# Patient Record
Sex: Female | Born: 1965 | Race: White | Marital: Married | Smoking: Former smoker
Health system: Southern US, Community
[De-identification: ages and names within clinical notes are randomized; demographics above are authoritative.]

## PROBLEM LIST (undated history)

## (undated) HISTORY — PX: WISDOM TOOTH EXTRACTION: SHX21

## (undated) HISTORY — PX: CLOSED REDUCTION SHOULDER DISLOCATION: SUR242

---

## 2020-10-01 ENCOUNTER — Other Ambulatory Visit: Payer: Self-pay | Admitting: Student

## 2020-10-01 DIAGNOSIS — R1013 Epigastric pain: Secondary | ICD-10-CM

## 2020-10-02 ENCOUNTER — Other Ambulatory Visit: Payer: Self-pay

## 2020-10-02 ENCOUNTER — Ambulatory Visit
Admission: RE | Admit: 2020-10-02 | Discharge: 2020-10-02 | Disposition: A | Discharge status: Home / Self Care | Payer: BC Managed Care – PPO | Source: Ambulatory Visit | Attending: Student | Admitting: Student

## 2020-10-02 DIAGNOSIS — G8929 Other chronic pain: Secondary | ICD-10-CM | POA: Insufficient documentation

## 2020-10-02 DIAGNOSIS — R1013 Epigastric pain: Secondary | ICD-10-CM | POA: Diagnosis present

## 2020-11-10 ENCOUNTER — Ambulatory Visit: Payer: Self-pay | Admitting: General Surgery

## 2020-11-10 NOTE — H&P (View-Only) (Signed)
PATIENT PROFILE: Kristin Chen is a 55 y.o. female who presents to the Clinic for consultation at the request of Dr. Bjorn Loser for evaluation of cholelithiasis.   PCP:  None   HISTORY OF PRESENT ILLNESS: Kristin Chen reports having abdominal pain since a year ago.  She endorses that the pain is mainly in the epigastric area.  The pain radiates to her back.  If any usually, night and wake her up.  She cannot associate any alleviating or aggravating factors.  Episode of pain duration a few hours.  Frequency of episodes are 2 or 3 times per month.  Denies fever or chills.  Endorses having associated nausea and vomiting with some of the episodes.   She had an ultrasound of the abdomen done that shows that her gallbladder is full of stones.  No gallbladder wall thickening or pericholecystic fluid.  I personally evaluated the images.  There is     PROBLEM LIST: Problem List  Date Reviewed: 09/30/2020  None        GENERAL REVIEW OF SYSTEMS:    General ROS: negative for - chills, fatigue, fever, weight gain or weight loss Allergy and Immunology ROS: negative for - hives  Hematological and Lymphatic ROS: negative for - bleeding problems or bruising, negative for palpable nodes Endocrine ROS: negative for - heat or cold intolerance, hair changes Respiratory ROS: negative for - cough, shortness of breath or wheezing Cardiovascular ROS: no chest pain or palpitations GI ROS: Positive for nausea, vomiting, abdominal pain, diarrhea Musculoskeletal ROS: negative for - joint swelling or muscle pain Neurological ROS: negative for - confusion, syncope Dermatological ROS: negative for pruritus and rash Psychiatric: negative for anxiety, depression, difficulty sleeping and memory loss   MEDICATIONS: Current Medications        Current Outpatient Medications  Medication Sig Dispense Refill   amoxicillin (AMOXIL) 500 MG tablet Take 2 tablets (1,000 mg total) by mouth 2 (two) times daily for 14 days 56 tablet 0    clarithromycin (BIAXIN) 500 MG tablet Take 1 tablet (500 mg total) by mouth every 12 (twelve) hours for 14 days 28 tablet 0   esomeprazole (NEXIUM) 20 MG DR capsule Take 1 capsule (20 mg total) by mouth once daily for 14 days 14 capsule 0   ondansetron (ZOFRAN-ODT) 4 MG disintegrating tablet Take 1 tablet (4 mg total) by mouth every 8 (eight) hours as needed for Nausea or Vomiting 20 tablet 0    No current facility-administered medications for this visit.        ALLERGIES: Patient has no known allergies.   PAST MEDICAL HISTORY: History reviewed. No pertinent past medical history.   PAST SURGICAL HISTORY: History reviewed. No pertinent surgical history.    FAMILY HISTORY:      Family History  Problem Relation Age of Onset   Osteopenia Mother     Osteopenia Sister     Coronary Artery Disease (Blocked arteries around heart) Maternal Grandfather        SOCIAL HISTORY: Social History           Socioeconomic History   Marital status: Married  Tobacco Use   Smoking status: Former Smoker      Quit date: 1997      Years since quitting: 25.5   Smokeless tobacco: Never Used  Scientific laboratory technician Use: Never used  Substance and Sexual Activity   Alcohol use: Yes      Comment: occasionally   Drug use: Never  PHYSICAL EXAM:    Vitals:    10/10/20 0837  BP: 137/89  Pulse: 66    Body mass index is 26.31 kg/m. Weight: 73.9 kg (163 lb)    GENERAL: Alert, active, oriented x3   HEENT: Pupils equal reactive to light. Extraocular movements are intact. Sclera clear. Palpebral conjunctiva normal red color.Pharynx clear.   NECK: Supple with no palpable mass and no adenopathy.   LUNGS: Sound clear with no rales rhonchi or wheezes.   HEART: Regular rhythm S1 and S2 without murmur.   ABDOMEN: Soft and depressible, nontender with no palpable mass, no hepatomegaly.    EXTREMITIES: Well-developed well-nourished symmetrical with no dependent edema.   NEUROLOGICAL: Awake  alert oriented, facial expression symmetrical, moving all extremities.   REVIEW OF DATA: I have reviewed the following data today:      Office Visit on 09/29/2020  Component Date Value   WBC (White Blood Cell Co* 09/29/2020 8.1    RBC (Red Blood Cell Coun* 09/29/2020 5.00    Hemoglobin 09/29/2020 14.5    Hematocrit 09/29/2020 41.2    MCV (Mean Corpuscular Vo* 09/29/2020 82.4    MCH (Mean Corpuscular He* 09/29/2020 29.0    MCHC (Mean Corpuscular H* 09/29/2020 35.2    Platelet Count 09/29/2020 267    RDW-CV (Red Cell Distrib* 09/29/2020 12.3    MPV (Mean Platelet Volum* 09/29/2020 9.8    Neutrophils 09/29/2020 5.04    Lymphocytes 09/29/2020 2.22    Monocytes 09/29/2020 0.70    Eosinophils 09/29/2020 0.08    Basophils 09/29/2020 0.03    Neutrophil % 09/29/2020 62.3    Lymphocyte % 09/29/2020 27.5    Monocyte % 09/29/2020 8.7    Eosinophil % 09/29/2020 1.0    Basophil% 09/29/2020 0.4    Immature Granulocyte % 09/29/2020 0.1    Immature Granulocyte Cou* 09/29/2020 0.01    Glucose 09/29/2020 95    Sodium 09/29/2020 140    Potassium 09/29/2020 3.9    Chloride 09/29/2020 108    Carbon Dioxide (CO2) 09/29/2020 25.8    Urea Nitrogen (BUN) 09/29/2020 15    Creatinine 09/29/2020 0.7    Glomerular Filtration Ra* 09/29/2020 88    Calcium 09/29/2020 9.0    AST  09/29/2020 18    ALT  09/29/2020 15    Alk Phos (alkaline Phosp* 09/29/2020 74    Albumin 09/29/2020 4.1    Bilirubin, Total 09/29/2020 0.5    Protein, Total 09/29/2020 7.0    A/G Ratio 09/29/2020 1.4    Amylase 09/29/2020 52    Lipase 09/29/2020 40    Color 09/29/2020 Yellow    Clarity 09/29/2020 Clear    Specific Gravity 09/29/2020 >=1.030    pH, Urine 09/29/2020 6.0    Protein, Urinalysis 09/29/2020 100  (!)   Glucose, Urinalysis 09/29/2020 Negative    Ketones, Urinalysis 09/29/2020 Negative    Blood, Urinalysis 09/29/2020 Trace (!)   Nitrite, Urinalysis 09/29/2020 Negative    Leukocyte Esterase, Urin* 09/29/2020  Negative    White Blood Cells, Urina* 09/29/2020 None Seen    Red Blood Cells, Urinaly* 09/29/2020 None Seen    Bacteria, Urinalysis 09/29/2020 None Seen    Squamous Epithelial Cell* 09/29/2020 None Seen    H. pylori, IgA Abs - Lab* 09/29/2020 19.7 (!)   H Pylori IgG - [facility] 09/29/2020 0.24    Vent Rate (bpm) 09/29/2020 52    PR Interval (msec) 09/29/2020 114    QRS Interval (msec) 09/29/2020 88    QT Interval (msec) 09/29/2020 428      QTc (msec) 09/29/2020 398       ASSESSMENT: Ms. Linderman is a 54 y.o. female presenting for consultation for cholelithiasis.     Patient was oriented about the diagnosis of cholelithiasis. Also oriented about what is the gallbladder, its anatomy and function and the implications of having stones. The patient was oriented about the treatment alternatives (observation vs cholecystectomy). Patient was oriented that a low percentage of patient will continue to have similar pain symptoms even after the gallbladder is removed. Surgical technique (open vs laparoscopic) was discussed. It was also discussed the goals of the surgery (decrease the pain episodes and avoid the risk of cholecystitis) and the risk of surgery including: bleeding, infection, common bile duct injury, stone retention, injury to other organs such as bowel, liver, stomach, other complications such as hernia, bowel obstruction among others. Also discussed with patient about anesthesia and its complications such as: reaction to medications, pneumonia, heart complications, death, among others.    Cholelithiasis without cholecystitis [K80.20]   PLAN: Robotic assisted laparoscopic cholecystectomy ([id number])   Patient verbalized understanding, all questions were answered, and were agreeable with the plan outlined above.        Shirly Bartosiewicz Cintron-Diaz, MD  

## 2020-11-10 NOTE — H&P (Signed)
PATIENT PROFILE: Kristin Chen is a 55 y.o. female who presents to the Clinic for consultation at the request of Dr. Bjorn Loser for evaluation of cholelithiasis.   PCP:  None   HISTORY OF PRESENT ILLNESS: Kristin Chen reports having abdominal pain since a year ago.  She endorses that the pain is mainly in the epigastric area.  The pain radiates to her back.  If any usually, night and wake her up.  She cannot associate any alleviating or aggravating factors.  Episode of pain duration a few hours.  Frequency of episodes are 2 or 3 times per month.  Denies fever or chills.  Endorses having associated nausea and vomiting with some of the episodes.   She had an ultrasound of the abdomen done that shows that her gallbladder is full of stones.  No gallbladder wall thickening or pericholecystic fluid.  I personally evaluated the images.  There is     PROBLEM LIST: Problem List  Date Reviewed: 09/30/2020  None        GENERAL REVIEW OF SYSTEMS:    General ROS: negative for - chills, fatigue, fever, weight gain or weight loss Allergy and Immunology ROS: negative for - hives  Hematological and Lymphatic ROS: negative for - bleeding problems or bruising, negative for palpable nodes Endocrine ROS: negative for - heat or cold intolerance, hair changes Respiratory ROS: negative for - cough, shortness of breath or wheezing Cardiovascular ROS: no chest pain or palpitations GI ROS: Positive for nausea, vomiting, abdominal pain, diarrhea Musculoskeletal ROS: negative for - joint swelling or muscle pain Neurological ROS: negative for - confusion, syncope Dermatological ROS: negative for pruritus and rash Psychiatric: negative for anxiety, depression, difficulty sleeping and memory loss   MEDICATIONS: Current Medications        Current Outpatient Medications  Medication Sig Dispense Refill   amoxicillin (AMOXIL) 500 MG tablet Take 2 tablets (1,000 mg total) by mouth 2 (two) times daily for 14 days 56 tablet 0    clarithromycin (BIAXIN) 500 MG tablet Take 1 tablet (500 mg total) by mouth every 12 (twelve) hours for 14 days 28 tablet 0   esomeprazole (NEXIUM) 20 MG DR capsule Take 1 capsule (20 mg total) by mouth once daily for 14 days 14 capsule 0   ondansetron (ZOFRAN-ODT) 4 MG disintegrating tablet Take 1 tablet (4 mg total) by mouth every 8 (eight) hours as needed for Nausea or Vomiting 20 tablet 0    No current facility-administered medications for this visit.        ALLERGIES: Patient has no known allergies.   PAST MEDICAL HISTORY: History reviewed. No pertinent past medical history.   PAST SURGICAL HISTORY: History reviewed. No pertinent surgical history.    FAMILY HISTORY:      Family History  Problem Relation Age of Onset   Osteopenia Mother     Osteopenia Sister     Coronary Artery Disease (Blocked arteries around heart) Maternal Grandfather        SOCIAL HISTORY: Social History           Socioeconomic History   Marital status: Married  Tobacco Use   Smoking status: Former Smoker      Quit date: 1997      Years since quitting: 25.5   Smokeless tobacco: Never Used  Scientific laboratory technician Use: Never used  Substance and Sexual Activity   Alcohol use: Yes      Comment: occasionally   Drug use: Never  PHYSICAL EXAM:    Vitals:    10/10/20 0837  BP: 137/89  Pulse: 66    Body mass index is 26.31 kg/m. Weight: 73.9 kg (163 lb)    GENERAL: Alert, active, oriented x3   HEENT: Pupils equal reactive to light. Extraocular movements are intact. Sclera clear. Palpebral conjunctiva normal red color.Pharynx clear.   NECK: Supple with no palpable mass and no adenopathy.   LUNGS: Sound clear with no rales rhonchi or wheezes.   HEART: Regular rhythm S1 and S2 without murmur.   ABDOMEN: Soft and depressible, nontender with no palpable mass, no hepatomegaly.    EXTREMITIES: Well-developed well-nourished symmetrical with no dependent edema.   NEUROLOGICAL: Awake  alert oriented, facial expression symmetrical, moving all extremities.   REVIEW OF DATA: I have reviewed the following data today:      Office Visit on 09/29/2020  Component Date Value   WBC (White Blood Cell Co* 09/29/2020 8.1    RBC (Red Blood Cell Coun* 09/29/2020 5.00    Hemoglobin 09/29/2020 14.5    Hematocrit 09/29/2020 41.2    MCV (Mean Corpuscular Vo* 09/29/2020 82.4    MCH (Mean Corpuscular He* 09/29/2020 29.0    MCHC (Mean Corpuscular H* 09/29/2020 35.2    Platelet Count 09/29/2020 267    RDW-CV (Red Cell Distrib* 09/29/2020 12.3    MPV (Mean Platelet Volum* 09/29/2020 9.8    Neutrophils 09/29/2020 5.04    Lymphocytes 09/29/2020 2.22    Monocytes 09/29/2020 0.70    Eosinophils 09/29/2020 0.08    Basophils 09/29/2020 0.03    Neutrophil % 09/29/2020 62.3    Lymphocyte % 09/29/2020 27.5    Monocyte % 09/29/2020 8.7    Eosinophil % 09/29/2020 1.0    Basophil% 09/29/2020 0.4    Immature Granulocyte % 09/29/2020 0.1    Immature Granulocyte Cou* 09/29/2020 0.01    Glucose 09/29/2020 95    Sodium 09/29/2020 140    Potassium 09/29/2020 3.9    Chloride 09/29/2020 108    Carbon Dioxide (CO2) 09/29/2020 25.8    Urea Nitrogen (BUN) 09/29/2020 15    Creatinine 09/29/2020 0.7    Glomerular Filtration Ra* 09/29/2020 88    Calcium 09/29/2020 9.0    AST  09/29/2020 18    ALT  09/29/2020 15    Alk Phos (alkaline Phosp* 09/29/2020 74    Albumin 09/29/2020 4.1    Bilirubin, Total 09/29/2020 0.5    Protein, Total 09/29/2020 7.0    A/G Ratio 09/29/2020 1.4    Amylase 09/29/2020 52    Lipase 09/29/2020 40    Color 09/29/2020 Yellow    Clarity 09/29/2020 Clear    Specific Gravity 09/29/2020 >=1.030    pH, Urine 09/29/2020 6.0    Protein, Urinalysis 09/29/2020 100  (!)   Glucose, Urinalysis 09/29/2020 Negative    Ketones, Urinalysis 09/29/2020 Negative    Blood, Urinalysis 09/29/2020 Trace (!)   Nitrite, Urinalysis 09/29/2020 Negative    Leukocyte Esterase, Urin* 09/29/2020  Negative    White Blood Cells, Urina* 09/29/2020 None Seen    Red Blood Cells, Urinaly* 09/29/2020 None Seen    Bacteria, Urinalysis 09/29/2020 None Seen    Squamous Epithelial Cell* 09/29/2020 None Seen    H. pylori, IgA Abs - Lab* 09/29/2020 19.7 (!)   H Pylori IgG - [facility] 09/29/2020 0.24    Vent Rate (bpm) 09/29/2020 52    PR Interval (msec) 09/29/2020 114    QRS Interval (msec) 09/29/2020 88    QT Interval (msec) 09/29/2020 428      QTc (msec) 09/29/2020 398       ASSESSMENT: Kristin Chen is a 54 y.o. female presenting for consultation for cholelithiasis.     Patient was oriented about the diagnosis of cholelithiasis. Also oriented about what is the gallbladder, its anatomy and function and the implications of having stones. The patient was oriented about the treatment alternatives (observation vs cholecystectomy). Patient was oriented that a low percentage of patient will continue to have similar pain symptoms even after the gallbladder is removed. Surgical technique (open vs laparoscopic) was discussed. It was also discussed the goals of the surgery (decrease the pain episodes and avoid the risk of cholecystitis) and the risk of surgery including: bleeding, infection, common bile duct injury, stone retention, injury to other organs such as bowel, liver, stomach, other complications such as hernia, bowel obstruction among others. Also discussed with patient about anesthesia and its complications such as: reaction to medications, pneumonia, heart complications, death, among others.    Cholelithiasis without cholecystitis [K80.20]   PLAN: Robotic assisted laparoscopic cholecystectomy ([id number])   Patient verbalized understanding, all questions were answered, and were agreeable with the plan outlined above.        Edgardo Cintron-Diaz, MD  

## 2020-11-14 ENCOUNTER — Encounter
Admission: RE | Admit: 2020-11-14 | Discharge: 2020-11-14 | Disposition: A | Discharge status: Home / Self Care | Payer: BC Managed Care – PPO | Source: Ambulatory Visit | Attending: General Surgery | Admitting: General Surgery

## 2020-11-14 ENCOUNTER — Other Ambulatory Visit: Payer: Self-pay

## 2020-11-14 NOTE — Patient Instructions (Signed)
Your procedure is scheduled on:  Report to DAY SURGERY DEPARTMENT LOCATED ON 2ND FLOOR MEDICAL MALL ENTRANCE. To find out your arrival time please call [phone] between 1PM - 3PM on .  Remember: Instructions that are not followed completely may result in serious medical risk, up to and including death, or upon the discretion of your surgeon and anesthesiologist your surgery may need to be rescheduled.     _X__ 1. Do not eat food after midnight the night before your procedure.                 No gum chewing or hard candies. You may drink clear liquids up to 2 hours                 before you are scheduled to arrive for your surgery- DO not drink clear                 liquids within 2 hours of the start of your surgery.                 Clear Liquids include:  water, apple juice without pulp, clear carbohydrate                 drink such as Clearfast or Gatorade, Black Coffee or Tea (Do not add                 anything to coffee or tea). Diabetics water only  __X__2.  On the morning of surgery brush your teeth with toothpaste and water, you                 may rinse your mouth with mouthwash if you wish.  Do not swallow any              toothpaste of mouthwash.     _X__ 3.  No Alcohol for 24 hours before or after surgery.   _X__ 4.  Do Not Smoke or use e-cigarettes For 24 Hours Prior to Your Surgery.                 Do not use any chewable tobacco products for at least 6 hours prior to                 surgery.  ____  5.  Bring all medications with you on the day of surgery if instructed.   __X__  6.  Notify your doctor if there is any change in your medical condition      (cold, fever, infections).     Do not wear jewelry, make-up, hairpins, clips or nail polish. Do not wear lotions, powders, or perfumes.  Do not shave 48 hours prior to surgery. Men may shave face and neck. Do not bring valuables to the hospital.    [facility] is not responsible for any belongings or  valuables.  Contacts, dentures/partials or body piercings may not be worn into surgery. Bring a case for your contacts, glasses or hearing aids, a denture cup will be supplied. Leave your suitcase in the car. After surgery it may be brought to your room. For patients admitted to the hospital, discharge time is determined by your treatment team.   Patients discharged the day of surgery will not be allowed to drive home.   Please read over the following fact sheets that you were given:   MRSA Information  __X__ Take these medicines the morning of surgery with A SIP OF WATER:      1.   2.   3.   4.  5.  6.  ____ Fleet Enema (as directed)   __X__ Use CHG Soap/SAGE wipes as directed  ____ Use inhalers on the day of surgery  ____ Stop metformin/Janumet/Farxiga 2 days prior to surgery    ____ Take 1/2 of usual insulin dose the night before surgery. No insulin the morning          of surgery.   ____ Stop Blood Thinners Coumadin/Plavix/Xarelto/Pleta/Pradaxa/Eliquis/Effient/Aspirin  on   Or contact your Surgeon, Cardiologist or Medical Doctor regarding  ability to stop your blood thinners  __X__ Stop Anti-inflammatories 7 days before surgery such as Advil, Ibuprofen, Motrin,  BC or Goodies Powder, Naprosyn, Naproxen, Aleve, Aspirin    __X__ Stop all herbal supplements, fish oil or vitamin E until after surgery.    ____ Bring C-Pap to the hospital.      

## 2020-11-21 ENCOUNTER — Encounter: Payer: Self-pay | Admitting: General Surgery

## 2020-11-21 ENCOUNTER — Ambulatory Visit
Admission: RE | Admit: 2020-11-21 | Discharge: 2020-11-21 | Disposition: A | Discharge status: Home / Self Care | Payer: BC Managed Care – PPO | Attending: General Surgery | Admitting: General Surgery

## 2020-11-21 ENCOUNTER — Other Ambulatory Visit: Payer: Self-pay

## 2020-11-21 ENCOUNTER — Ambulatory Visit: Payer: BC Managed Care – PPO | Admitting: Anesthesiology

## 2020-11-21 ENCOUNTER — Encounter
Admission: RE | Disposition: A | Discharge status: Home / Self Care | Payer: Self-pay | Source: Home / Self Care | Attending: General Surgery

## 2020-11-21 DIAGNOSIS — Z87891 Personal history of nicotine dependence: Secondary | ICD-10-CM | POA: Insufficient documentation

## 2020-11-21 DIAGNOSIS — K8012 Calculus of gallbladder with acute and chronic cholecystitis without obstruction: Secondary | ICD-10-CM | POA: Insufficient documentation

## 2020-11-21 SURGERY — CHOLECYSTECTOMY, ROBOT-ASSISTED, LAPAROSCOPIC
Anesthesia: General | Site: Abdomen

## 2020-11-21 MED ORDER — CHLORHEXIDINE GLUCONATE 0.12 % MT SOLN
OROMUCOSAL | Status: AC
  Administered 2020-11-21: 15 mL via OROMUCOSAL
  Filled 2020-11-21: qty 15

## 2020-11-21 MED ORDER — INDOCYANINE GREEN 25 MG IV SOLR
INTRAVENOUS | Status: AC
  Filled 2020-11-21: qty 10

## 2020-11-21 MED ORDER — CEFAZOLIN SODIUM-DEXTROSE 2-4 GM/100ML-% IV SOLN
INTRAVENOUS | Status: AC
  Filled 2020-11-21: qty 100

## 2020-11-21 MED ORDER — ACETAMINOPHEN 10 MG/ML IV SOLN
INTRAVENOUS | Status: AC
  Filled 2020-11-21: qty 100

## 2020-11-21 MED ORDER — PROPOFOL 10 MG/ML IV BOLUS
INTRAVENOUS | Status: DC | PRN
  Administered 2020-11-21: 120 mg via INTRAVENOUS
  Administered 2020-11-21: 30 mg via INTRAVENOUS
  Administered 2020-11-21: 25 mg via INTRAVENOUS

## 2020-11-21 MED ORDER — LIDOCAINE HCL (PF) 2 % IJ SOLN
INTRAMUSCULAR | Status: AC
  Filled 2020-11-21: qty 5

## 2020-11-21 MED ORDER — MIDAZOLAM HCL 2 MG/2ML IJ SOLN
INTRAMUSCULAR | Status: AC
  Filled 2020-11-21: qty 2

## 2020-11-21 MED ORDER — SUGAMMADEX SODIUM 200 MG/2ML IV SOLN
INTRAVENOUS | Status: DC | PRN
  Administered 2020-11-21: 100 mg via INTRAVENOUS
  Administered 2020-11-21: 50 mg via INTRAVENOUS

## 2020-11-21 MED ORDER — KETOROLAC TROMETHAMINE 30 MG/ML IJ SOLN
INTRAMUSCULAR | Status: AC
  Filled 2020-11-21: qty 1

## 2020-11-21 MED ORDER — DEXAMETHASONE SODIUM PHOSPHATE 10 MG/ML IJ SOLN
INTRAMUSCULAR | Status: DC | PRN
  Administered 2020-11-21: 10 mg via INTRAVENOUS

## 2020-11-21 MED ORDER — LIDOCAINE HCL (CARDIAC) PF 100 MG/5ML IV SOSY
PREFILLED_SYRINGE | INTRAVENOUS | Status: DC | PRN
  Administered 2020-11-21: 50 mg via INTRAVENOUS

## 2020-11-21 MED ORDER — HYDROCODONE-ACETAMINOPHEN 5-325 MG PO TABS: 1.0000 | ORAL_TABLET | ORAL | 0 refills | Status: AC | PRN

## 2020-11-21 MED ORDER — MIDAZOLAM HCL 2 MG/2ML IJ SOLN
INTRAMUSCULAR | Status: DC | PRN
  Administered 2020-11-21 (×2): 1 mg via INTRAVENOUS

## 2020-11-21 MED ORDER — ORAL CARE MOUTH RINSE: 15.0000 mL | Freq: Once | OROMUCOSAL | Status: AC

## 2020-11-21 MED ORDER — LACTATED RINGERS IV SOLN: INTRAVENOUS | Status: DC

## 2020-11-21 MED ORDER — ACETAMINOPHEN 10 MG/ML IV SOLN
INTRAVENOUS | Status: DC | PRN
  Administered 2020-11-21: 1000 mg via INTRAVENOUS

## 2020-11-21 MED ORDER — FENTANYL CITRATE (PF) 100 MCG/2ML IJ SOLN
INTRAMUSCULAR | Status: AC
  Filled 2020-11-21: qty 2

## 2020-11-21 MED ORDER — KETOROLAC TROMETHAMINE 30 MG/ML IJ SOLN
INTRAMUSCULAR | Status: DC | PRN
Start: 2020-11-21 — End: 2020-11-21
  Administered 2020-11-21: 30 mg via INTRAVENOUS

## 2020-11-21 MED ORDER — ROCURONIUM BROMIDE 100 MG/10ML IV SOLN
INTRAVENOUS | Status: DC | PRN
  Administered 2020-11-21: 60 mg via INTRAVENOUS

## 2020-11-21 MED ORDER — PROPOFOL 10 MG/ML IV BOLUS
INTRAVENOUS | Status: AC
  Filled 2020-11-21: qty 20

## 2020-11-21 MED ORDER — ONDANSETRON HCL 4 MG/2ML IJ SOLN
INTRAMUSCULAR | Status: DC | PRN
  Administered 2020-11-21: 4 mg via INTRAVENOUS

## 2020-11-21 MED ORDER — BUPIVACAINE-EPINEPHRINE (PF) 0.25% -1:200000 IJ SOLN
INTRAMUSCULAR | Status: AC
  Filled 2020-11-21: qty 30

## 2020-11-21 MED ORDER — 0.9 % SODIUM CHLORIDE (POUR BTL) OPTIME
TOPICAL | Status: DC | PRN
  Administered 2020-11-21: 500 mL

## 2020-11-21 MED ORDER — DEXAMETHASONE SODIUM PHOSPHATE 10 MG/ML IJ SOLN
INTRAMUSCULAR | Status: AC
  Filled 2020-11-21: qty 1

## 2020-11-21 MED ORDER — FENTANYL CITRATE (PF) 100 MCG/2ML IJ SOLN
INTRAMUSCULAR | Status: DC | PRN
  Administered 2020-11-21 (×2): 50 ug via INTRAVENOUS

## 2020-11-21 MED ORDER — INDOCYANINE GREEN 25 MG IV SOLR
1.2500 mg | Freq: Once | INTRAVENOUS | Status: AC
  Administered 2020-11-21: 1.25 mg via INTRAVENOUS
  Filled 2020-11-21: qty 0.5

## 2020-11-21 MED ORDER — SODIUM CHLORIDE 0.9 % IR SOLN
Status: DC | PRN
  Administered 2020-11-21: 1000 mL

## 2020-11-21 MED ORDER — ONDANSETRON HCL 4 MG/2ML IJ SOLN: 4.0000 mg | Freq: Once | INTRAMUSCULAR | Status: DC | PRN

## 2020-11-21 MED ORDER — BUPIVACAINE-EPINEPHRINE 0.25% -1:200000 IJ SOLN
INTRAMUSCULAR | Status: DC | PRN
  Administered 2020-11-21: 30 mL

## 2020-11-21 MED ORDER — FAMOTIDINE 20 MG PO TABS: 20.0000 mg | ORAL_TABLET | Freq: Once | ORAL | Status: AC

## 2020-11-21 MED ORDER — ONDANSETRON HCL 4 MG/2ML IJ SOLN
INTRAMUSCULAR | Status: AC
  Filled 2020-11-21: qty 2

## 2020-11-21 MED ORDER — CHLORHEXIDINE GLUCONATE 0.12 % MT SOLN: 15.0000 mL | Freq: Once | OROMUCOSAL | Status: AC

## 2020-11-21 MED ORDER — FAMOTIDINE 20 MG PO TABS
ORAL_TABLET | ORAL | Status: AC
  Administered 2020-11-21: 20 mg via ORAL
  Filled 2020-11-21: qty 1

## 2020-11-21 MED ORDER — FENTANYL CITRATE (PF) 100 MCG/2ML IJ SOLN
25.0000 ug | INTRAMUSCULAR | Status: DC | PRN
  Administered 2020-11-21: 25 ug via INTRAVENOUS

## 2020-11-21 MED ORDER — CEFAZOLIN SODIUM-DEXTROSE 2-4 GM/100ML-% IV SOLN
2.0000 g | INTRAVENOUS | Status: AC
  Administered 2020-11-21: 2 g via INTRAVENOUS

## 2020-11-21 SURGICAL SUPPLY — 53 items
ADH SKN CLS APL DERMABOND .7 (GAUZE/BANDAGES/DRESSINGS) ×1
APL PRP STRL LF DISP 70% ISPRP (MISCELLANEOUS) ×1
BAG INFUSER PRESSURE 100CC (MISCELLANEOUS) IMPLANT
BAG SPEC RTRVL LRG 6X4 10 (ENDOMECHANICALS) ×1
BLADE SURG SZ11 CARB STEEL (BLADE) ×2 IMPLANT
CANISTER SUCT 1200ML W/VALVE (MISCELLANEOUS) ×2 IMPLANT
CANNULA REDUC XI 12-8 STAPL (CANNULA) ×1
CANNULA REDUCER 12-8 DVNC XI (CANNULA) ×1 IMPLANT
CHLORAPREP W/TINT 26 (MISCELLANEOUS) ×2 IMPLANT
CLIP VESOLOCK MED LG 6/CT (CLIP) ×2 IMPLANT
DECANTER SPIKE VIAL GLASS SM (MISCELLANEOUS) ×4 IMPLANT
DEFOGGER SCOPE WARMER CLEARIFY (MISCELLANEOUS) ×2 IMPLANT
DERMABOND ADVANCED (GAUZE/BANDAGES/DRESSINGS) ×1
DERMABOND ADVANCED .7 DNX12 (GAUZE/BANDAGES/DRESSINGS) ×1 IMPLANT
DRAPE ARM DVNC X/XI (DISPOSABLE) ×4 IMPLANT
DRAPE COLUMN DVNC XI (DISPOSABLE) ×1 IMPLANT
DRAPE DA VINCI XI ARM (DISPOSABLE) ×4
DRAPE DA VINCI XI COLUMN (DISPOSABLE) ×1
ELECT REM PT RETURN 9FT ADLT (ELECTROSURGICAL) ×2
ELECTRODE REM PT RTRN 9FT ADLT (ELECTROSURGICAL) ×1 IMPLANT
GAUZE 4X4 16PLY ~~LOC~~+RFID DBL (SPONGE) ×2 IMPLANT
GLOVE SURG ENC MOIS LTX SZ6.5 (GLOVE) ×4 IMPLANT
GLOVE SURG UNDER POLY LF SZ6.5 (GLOVE) ×4 IMPLANT
GOWN STRL REUS W/ TWL LRG LVL3 (GOWN DISPOSABLE) ×3 IMPLANT
GOWN STRL REUS W/TWL LRG LVL3 (GOWN DISPOSABLE) ×6
GRASPER SUT TROCAR 14GX15 (MISCELLANEOUS) ×2 IMPLANT
IRRIGATOR SUCT 8 DISP DVNC XI (IRRIGATION / IRRIGATOR) ×1 IMPLANT
IRRIGATOR SUCTION 8MM XI DISP (IRRIGATION / IRRIGATOR) ×1
IV NS 1000ML (IV SOLUTION) ×2
IV NS 1000ML BAXH (IV SOLUTION) ×1 IMPLANT
KIT PINK PAD W/HEAD ARE REST (MISCELLANEOUS) ×2
KIT PINK PAD W/HEAD ARM REST (MISCELLANEOUS) ×1 IMPLANT
LABEL OR SOLS (LABEL) ×2 IMPLANT
MANIFOLD NEPTUNE II (INSTRUMENTS) ×2 IMPLANT
NEEDLE HYPO 22GX1.5 SAFETY (NEEDLE) ×2 IMPLANT
NEEDLE INSUFFLATION 14GA 120MM (NEEDLE) ×2 IMPLANT
NS IRRIG 500ML POUR BTL (IV SOLUTION) ×2 IMPLANT
OBTURATOR OPTICAL STANDARD 8MM (TROCAR) ×1
OBTURATOR OPTICAL STND 8 DVNC (TROCAR) ×1
OBTURATOR OPTICALSTD 8 DVNC (TROCAR) ×1 IMPLANT
PACK LAP CHOLECYSTECTOMY (MISCELLANEOUS) ×2 IMPLANT
POUCH SPECIMEN RETRIEVAL 10MM (ENDOMECHANICALS) ×2 IMPLANT
SEAL CANN UNIV 5-8 DVNC XI (MISCELLANEOUS) ×3 IMPLANT
SEAL XI 5MM-8MM UNIVERSAL (MISCELLANEOUS) ×3
SET TUBE SMOKE EVAC HIGH FLOW (TUBING) ×2 IMPLANT
SOLUTION ELECTROLUBE (MISCELLANEOUS) ×2 IMPLANT
SPONGE T-LAP 4X18 ~~LOC~~+RFID (SPONGE) ×2 IMPLANT
STAPLER CANNULA SEAL DVNC XI (STAPLE) ×1 IMPLANT
STAPLER CANNULA SEAL XI (STAPLE) ×1
SUT MNCRL 4-0 (SUTURE) ×4
SUT MNCRL 4-0 27XMFL (SUTURE) ×2
SUT VICRYL 0 AB UR-6 (SUTURE) ×2 IMPLANT
SUTURE MNCRL 4-0 27XMF (SUTURE) ×2 IMPLANT

## 2020-11-21 NOTE — Interval H&P Note (Signed)
History and Physical Interval Note:  11/21/2020 8:32 AM  Kristin Chen  has presented today for surgery, with the diagnosis of K80.20 Calculus of gallbladder w/o cholecystitis or obstruction.  The various methods of treatment have been discussed with the patient and family. After consideration of risks, benefits and other options for treatment, the patient has consented to  Procedure(s): XI ROBOTIC ASSISTED LAPAROSCOPIC CHOLECYSTECTOMY (N/A) as a surgical intervention.  The patient's history has been reviewed, patient examined, no change in status, stable for surgery.  I have reviewed the patient's chart and labs.  Questions were answered to the patient's satisfaction.     Carolan Shiver

## 2020-11-21 NOTE — Op Note (Signed)
Preoperative diagnosis: Cholelithiasis  Postoperative diagnosis: Cholelithiasis with cholecystitis  Procedure: Robotic Assisted Laparoscopic Cholecystectomy.   Anesthesia: GETA   Surgeon: Dr. Hazle Quant  Wound Classification: Clean Contaminated  Indications: Patient is a 55 y.o. female developed right upper quadrant pain and on workup was found to have cholelithiasis with a normal common duct. Robotic Assisted Laparoscopic cholecystectomy was elected.  Findings: Acutely inflamed gallbladder with pericholecystic edema   Critical view of safety achieved   Cystic duct and artery identified, ligated and divided Adequate hemostasis  Description of procedure: The patient was placed on the operating table in the supine position. General anesthesia was induced. A time-out was completed verifying correct patient, procedure, site, positioning, and implant(s) and/or special equipment prior to beginning this procedure. An orogastric tube was placed. The abdomen was prepped and draped in the usual sterile fashion.  An incision was made in a natural skin line below the umbilicus.  The fascia was elevated and the Veress needle inserted. Proper position was confirmed by aspiration and saline meniscus test.  The abdomen was insufflated with carbon dioxide to a pressure of 15 mmHg. The patient tolerated insufflation well. A 8-mm trocar was then inserted in optiview fashion.  The laparoscope was inserted and the abdomen inspected. No injuries from initial trocar placement were noted. Additional trocars were then inserted in the following locations: an 8-mm trocar in the left lateral abdomen, and another two 8-mm trocars to the right side of the abdomen 5 cm appart. The umbilical trocar was changed to a 12 mm trocar all under direct visualization. The abdomen was inspected and no abnormalities were found. The table was placed in the reverse Trendelenburg position with the right side up. The robotic arms  were docked and target anatomy identified. Instrument inserted under direct visualization.  Filmy adhesions between the gallbladder and omentum, duodenum and transverse colon were lysed with electrocautery. The dome of the gallbladder was grasped with a prograsp and retracted over the dome of the liver. The infundibulum was also grasped with an atraumatic grasper and retracted toward the right lower quadrant. This maneuver exposed Calot's triangle. The peritoneum overlying the gallbladder infundibulum was then incised and the cystic duct and cystic artery identified and circumferentially dissected. Critical view of safety reviewed before ligating any structure. Firefly images taken to visualize biliary ducts. The cystic duct and cystic artery were then doubly clipped and divided close to the gallbladder.  The gallbladder was then dissected from its peritoneal attachments by electrocautery. Bile spillage from the dome. Hemostasis was checked and the gallbladder and contained stones were removed using an endoscopic retrieval bag. The gallbladder was passed off the table as a specimen. The gallbladder fossa was copiously irrigated with saline and hemostasis was obtained. There was no evidence of bleeding from the gallbladder fossa or cystic artery or leakage of the bile from the cystic duct stump. Secondary trocars were removed under direct vision. No bleeding was noted. The robotic arms were undoked. The scope was withdrawn and the umbilical trocar removed. The abdomen was allowed to collapse. The fascia of the 61mm trocar sites was closed with figure-of-eight 0 vicryl sutures. The skin was closed with subcuticular sutures of 4-0 monocryl and topical skin adhesive. The orogastric tube was removed.  The patient tolerated the procedure well and was taken to the postanesthesia care unit in stable condition.   Specimen: Gallbladder  Complications: None  EBL: 5 mL

## 2020-11-21 NOTE — Transfer of Care (Signed)
Immediate Anesthesia Transfer of Care Note  Patient: Kristin Chen  Procedure(s) Performed: XI ROBOTIC ASSISTED LAPAROSCOPIC CHOLECYSTECTOMY (Abdomen)  Patient Location: PACU  Anesthesia Type:General  Level of Consciousness: drowsy  Airway & Oxygen Therapy: Patient Spontanous Breathing and Patient connected to face mask oxygen  Post-op Assessment: Report given to RN and Post -op Vital signs reviewed and stable  Post vital signs: Reviewed and stable  Last Vitals:  Vitals Value Taken Time  BP 105/74 11/21/20 1101  Temp 36.1 C 11/21/20 1101  Pulse 69 11/21/20 1104  Resp 14 11/21/20 1104  SpO2 100 % 11/21/20 1104  Vitals shown include unvalidated device data.  Last Pain:  Vitals:   11/21/20 0818  TempSrc: Temporal  PainSc: 0-No pain         Complications: No notable events documented.

## 2020-11-21 NOTE — Anesthesia Procedure Notes (Signed)
Procedure Name: Intubation Date/Time: 11/21/2020 9:13 AM Performed by: Renee Ramus, CRNA Pre-anesthesia Checklist: Patient identified, Emergency Drugs available, Suction available and Patient being monitored Patient Re-evaluated:Patient Re-evaluated prior to induction Oxygen Delivery Method: Circle system utilized Preoxygenation: Pre-oxygenation with 100% oxygen Induction Type: IV induction Ventilation: Mask ventilation without difficulty Laryngoscope Size: McGraph and 4 Grade View: Grade I Tube type: Oral Tube size: 7.0 mm Number of attempts: 1 Airway Equipment and Method: Stylet and Oral airway Placement Confirmation: ETT inserted through vocal cords under direct vision, positive ETCO2 and breath sounds checked- equal and bilateral Secured at: 22 cm Tube secured with: Tape Dental Injury: Teeth and Oropharynx as per pre-operative assessment

## 2020-11-21 NOTE — Discharge Instructions (Addendum)
  Diet: Resume home heart healthy regular diet.   Activity: No heavy lifting >20 pounds (children, pets, laundry, garbage) or strenuous activity until follow-up, but light activity and walking are encouraged. Do not drive or drink alcohol if taking narcotic pain medications.  Wound care: May shower with soapy water and pat dry (do not rub incisions), but no baths or submerging incision underwater until follow-up. (no swimming)   Medications: Resume all home medications. For mild to moderate pain: acetaminophen (Tylenol) ***or ibuprofen (if no kidney disease). Combining Tylenol with alcohol can substantially increase your risk of causing liver disease. Narcotic pain medications, if prescribed, can be used for severe pain, though may cause nausea, constipation, and drowsiness. Do not combine Tylenol and Norco within a 6 hour period as Norco contains Tylenol. If you do not need the narcotic pain medication, you do not need to fill the prescription.  Call office ([phone]) at any time if any questions, worsening pain, fevers/chills, bleeding, drainage from incision site, or other concerns.   AMBULATORY SURGERY  DISCHARGE INSTRUCTIONS   The drugs that you were given will stay in your system until tomorrow so for the next 24 hours you should not:  Drive an automobile Make any legal decisions Drink any alcoholic beverage   You may resume regular meals tomorrow.  Today it is better to start with liquids and gradually work up to solid foods.  You may eat anything you prefer, but it is better to start with liquids, then soup and crackers, and gradually work up to solid foods.   Please notify your doctor immediately if you have any unusual bleeding, trouble breathing, redness and pain at the surgery site, drainage, fever, or pain not relieved by medication.    Additional Instructions:        Please contact your physician with any problems or Same Day Surgery at [phone], Monday  through Friday 6 am to 4 pm, or [facility] number at [phone].  

## 2020-11-21 NOTE — Anesthesia Preprocedure Evaluation (Signed)
Anesthesia Evaluation  Patient identified by MRN, date of birth, ID band Patient awake    Reviewed: Allergy & Precautions, H&P , NPO status , Patient's Chart, lab work & pertinent test results, reviewed documented beta blocker date and time   Airway Mallampati: II  TM Distance: >3 FB Neck ROM: full    Dental  (+) Teeth Intact   Pulmonary neg pulmonary ROS, former smoker,    Pulmonary exam normal        Cardiovascular Exercise Tolerance: Good negative cardio ROS Normal cardiovascular exam Rhythm:regular Rate:Normal     Neuro/Psych negative neurological ROS  negative psych ROS   GI/Hepatic negative GI ROS, Neg liver ROS,   Endo/Other  negative endocrine ROS  Renal/GU negative Renal ROS  negative genitourinary   Musculoskeletal   Abdominal   Peds  Hematology negative hematology ROS (+)   Anesthesia Other Findings History reviewed. No pertinent past medical history. Past Surgical History: No date: CLOSED REDUCTION SHOULDER DISLOCATION No date: WISDOM TOOTH EXTRACTION BMI    Body Mass Index: 26.15 kg/m     Reproductive/Obstetrics negative OB ROS                             Anesthesia Physical Anesthesia Plan  ASA: 2  Anesthesia Plan: General ETT   Post-op Pain Management:    Induction:   PONV Risk Score and Plan:   Airway Management Planned:   Additional Equipment:   Intra-op Plan:   Post-operative Plan:   Informed Consent: I have reviewed the patients History and Physical, chart, labs and discussed the procedure including the risks, benefits and alternatives for the proposed anesthesia with the patient or authorized representative who has indicated his/her understanding and acceptance.     Dental Advisory Given  Plan Discussed with: CRNA  Anesthesia Plan Comments:         Anesthesia Quick Evaluation

## 2020-11-24 LAB — SURGICAL PATHOLOGY

## 2020-11-24 NOTE — Anesthesia Postprocedure Evaluation (Signed)
Anesthesia Post Note  Patient: Kristin Chen  Procedure(s) Performed: XI ROBOTIC ASSISTED LAPAROSCOPIC CHOLECYSTECTOMY (Abdomen)  Patient location during evaluation: PACU Anesthesia Type: General Level of consciousness: awake and alert Pain management: pain level controlled Vital Signs Assessment: post-procedure vital signs reviewed and stable Respiratory status: spontaneous breathing, nonlabored ventilation, respiratory function stable and patient connected to nasal cannula oxygen Cardiovascular status: blood pressure returned to baseline and stable Postop Assessment: no apparent nausea or vomiting Anesthetic complications: no   No notable events documented.   Last Vitals:  Vitals:   11/21/20 1149 11/21/20 1159  BP:  119/70  Pulse: 62 (!) 52  Resp: 13 14  Temp:  36.7 C  SpO2: 97% 97%    Last Pain:  Vitals:   11/24/20 0822  TempSrc:   PainSc: 2                  Yevette Edwards

## 2021-11-25 IMAGING — US US ABDOMEN COMPLETE
1 series · 14 of 25 positions shown · non-contrast
Comparison: Plain film 09/30/2020, report only.

CLINICAL DATA: Chronic epigastric pain.

EXAM:
ABDOMEN ULTRASOUND COMPLETE

[Series 1: abdomen us · 14 of 158 slices shown]
[im 1/158]
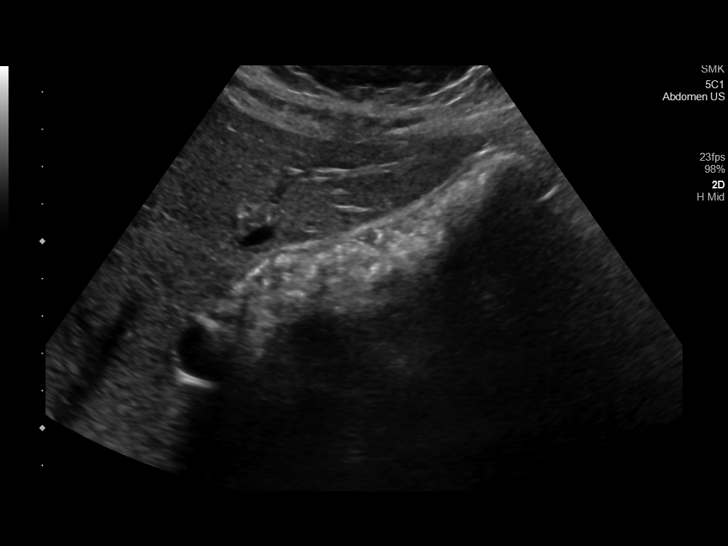
[im 14/158]
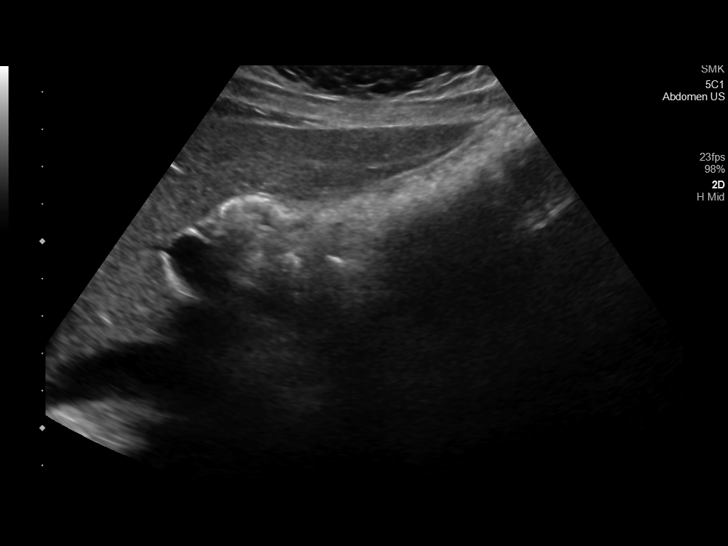
[im 27/158]
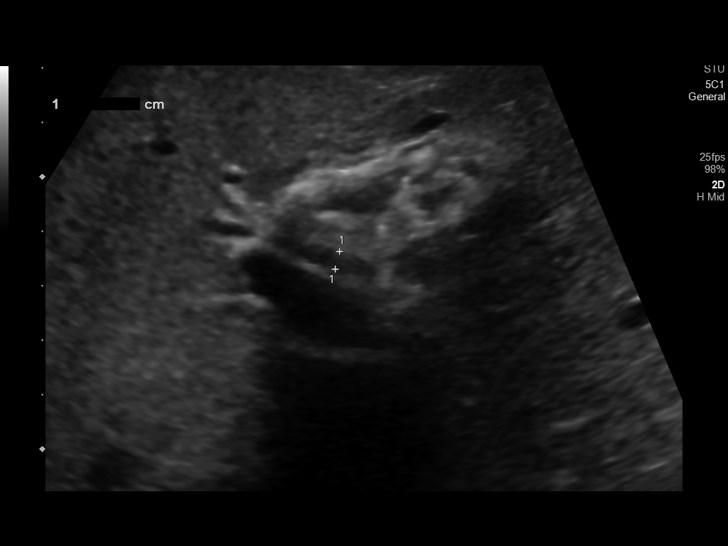
[im 40/158]
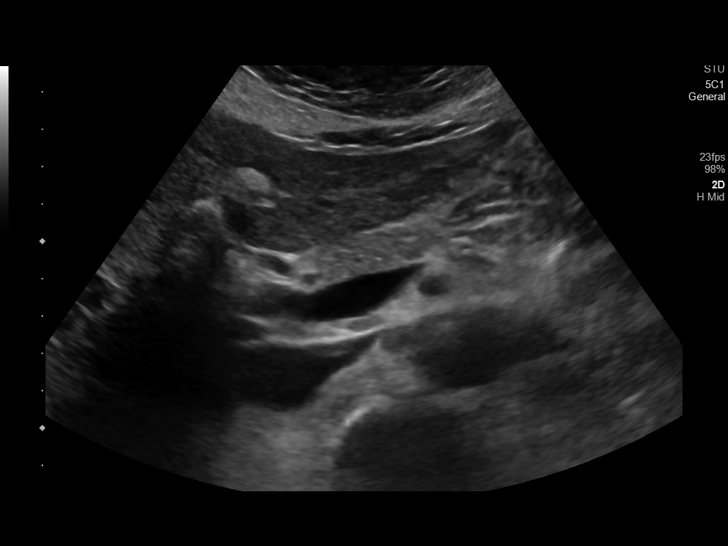
[im 53/158]
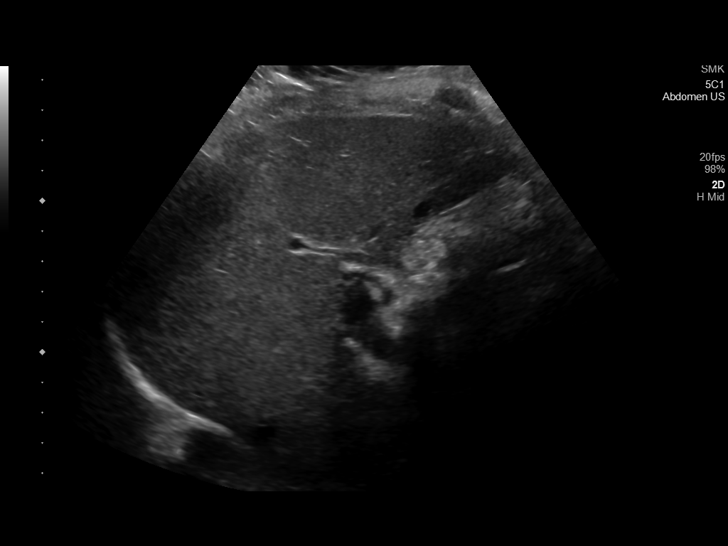
[im 59/158]
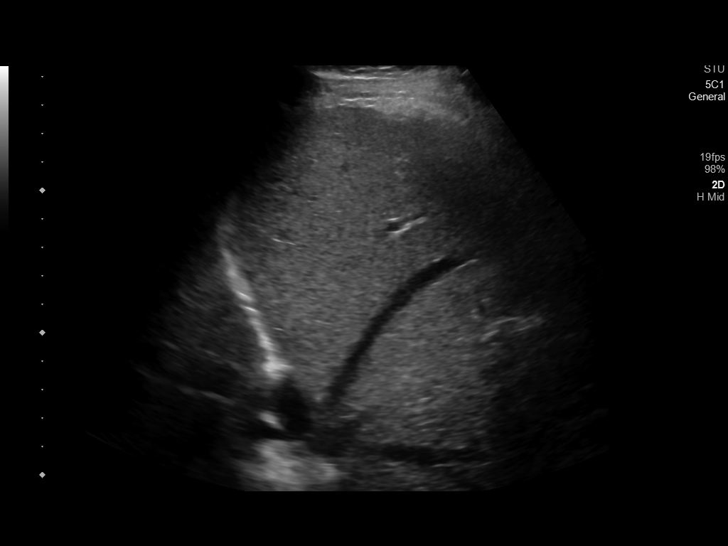
[im 72/158]
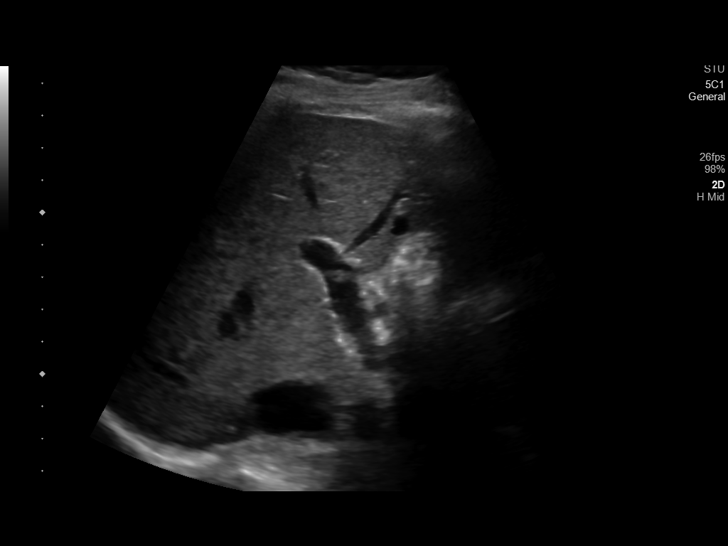
[im 86/158]
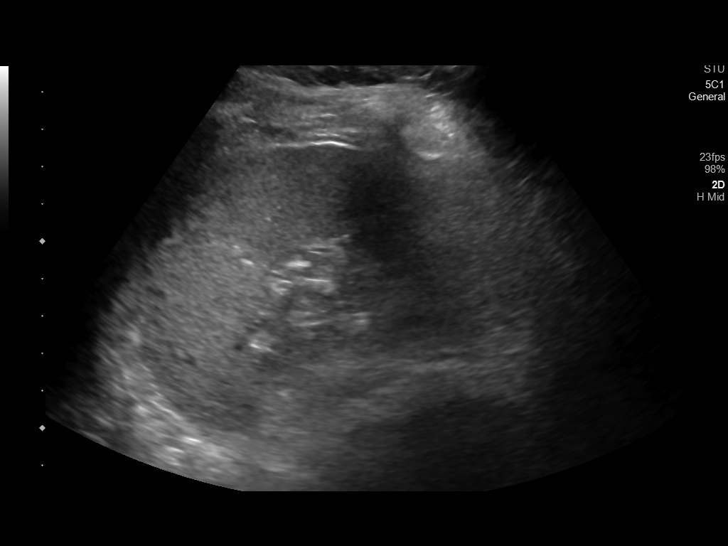
[im 99/158]
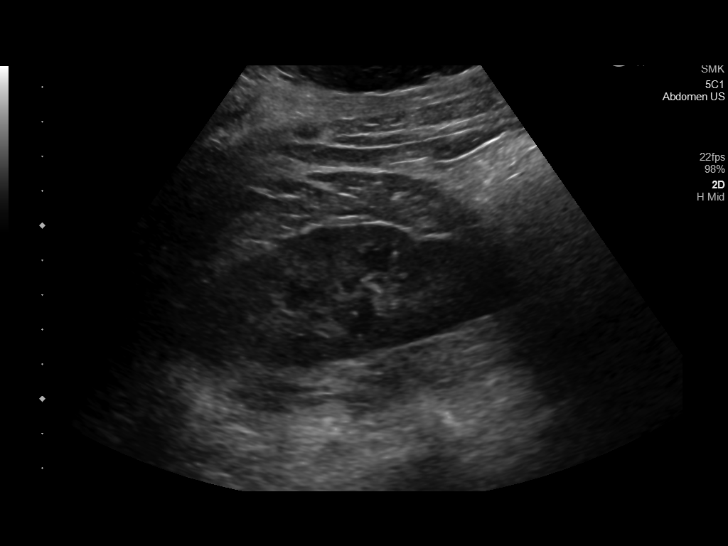
[im 105/158]
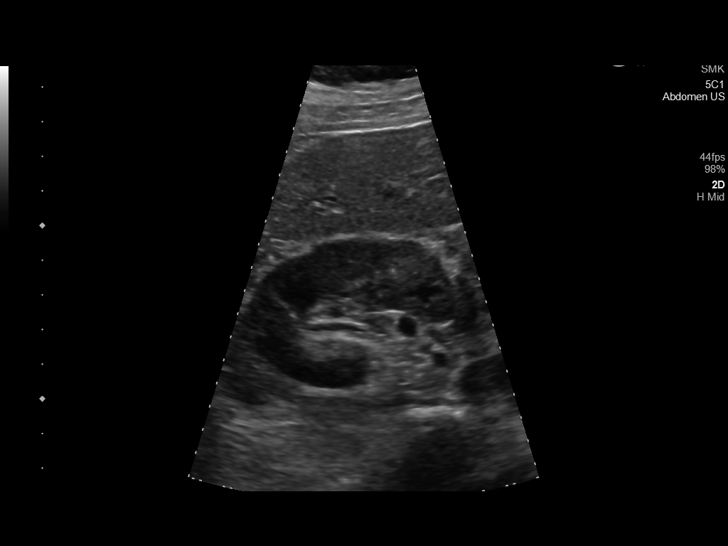
[im 118/158]
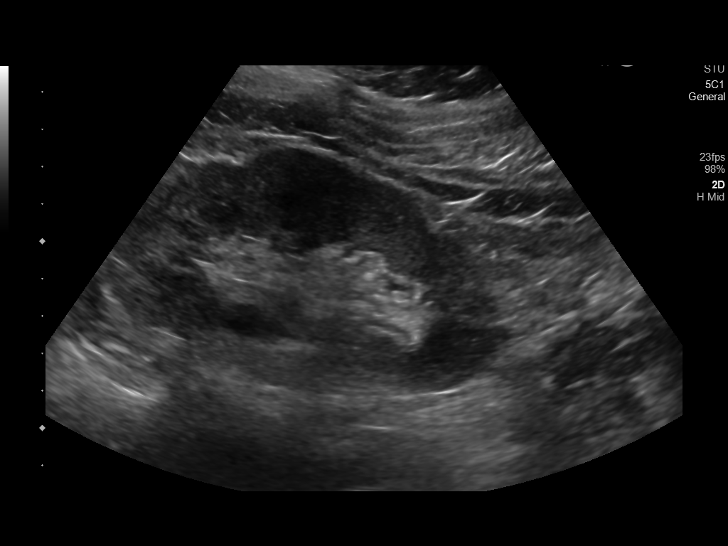
[im 131/158]
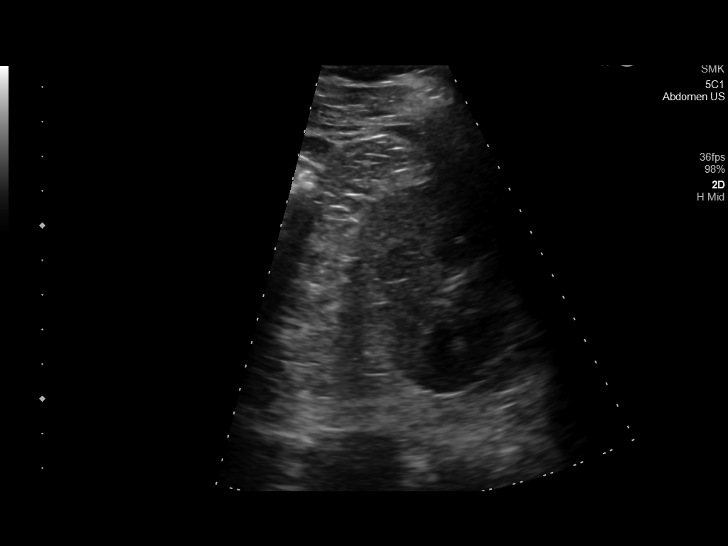
[im 144/158]
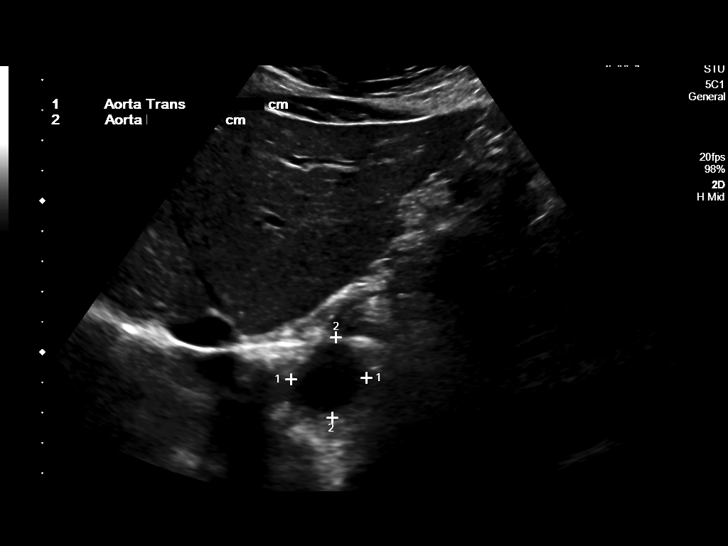
[im 158/158]
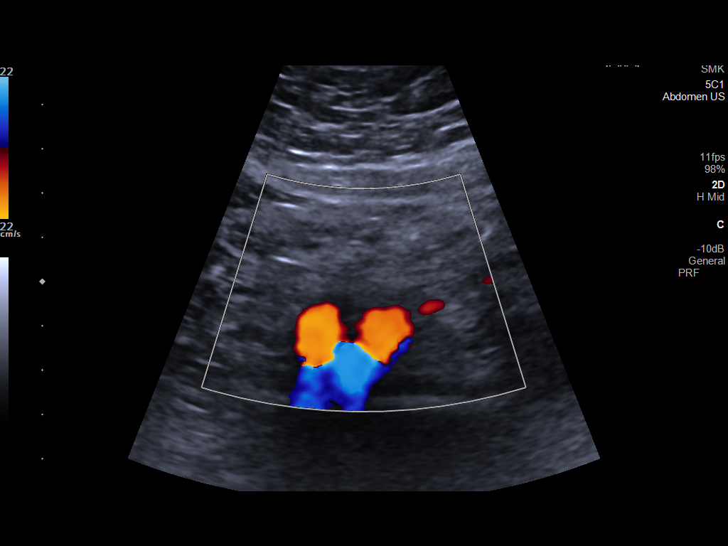

[14 of 25 positions shown; findings below may reference images not displayed]

FINDINGS: Gallbladder: A "wall echo shadow" sign is indicative of a stone
filled gallbladder. Sonographic Murphy's sign was not elicited.

Common bile duct: Diameter: Normal, 3 mm.

Liver: No focal lesion identified. Within normal limits in
parenchymal echogenicity. Portal vein is patent on color Doppler
imaging with normal direction of blood flow towards the liver.

IVC: No abnormality visualized.

Pancreas: Visualized portion unremarkable.

Spleen: Size and appearance within normal limits.

Right Kidney: Length: 11.3 cm. No hydronephrosis. 1.0 cm lower pole
cyst. Normal echogenicity.

Left Kidney: Length: 11.0 cm. Echogenicity within normal limits. No
mass or hydronephrosis visualized.

Abdominal aorta: No aneurysm visualized.

Other findings: None.
IMPRESSION: Stone filled gallbladder. No evidence of acute cholecystitis or
biliary duct dilatation.
